# Patient Record
Sex: Female | Born: 1955 | Race: White | Hispanic: No
Health system: Southern US, Community
[De-identification: ages and names within clinical notes are randomized; demographics above are authoritative.]

## PROBLEM LIST (undated history)

## (undated) DIAGNOSIS — I10 Essential (primary) hypertension: Secondary | ICD-10-CM

## (undated) DIAGNOSIS — E119 Type 2 diabetes mellitus without complications: Secondary | ICD-10-CM

---

## 2020-05-11 ENCOUNTER — Emergency Department (HOSPITAL_COMMUNITY): Payer: BLUE CROSS/BLUE SHIELD

## 2020-05-11 ENCOUNTER — Encounter (HOSPITAL_COMMUNITY): Payer: Self-pay

## 2020-05-11 ENCOUNTER — Emergency Department (HOSPITAL_COMMUNITY)
Admission: EM | Admit: 2020-05-11 | Discharge: 2020-05-11 | Disposition: A | Discharge status: Home / Self Care | Payer: BLUE CROSS/BLUE SHIELD | Attending: Emergency Medicine | Admitting: Emergency Medicine

## 2020-05-11 DIAGNOSIS — R519 Headache, unspecified: Secondary | ICD-10-CM | POA: Diagnosis present

## 2020-05-11 DIAGNOSIS — Z20822 Contact with and (suspected) exposure to covid-19: Secondary | ICD-10-CM | POA: Insufficient documentation

## 2020-05-11 DIAGNOSIS — R531 Weakness: Secondary | ICD-10-CM | POA: Insufficient documentation

## 2020-05-11 DIAGNOSIS — R29818 Other symptoms and signs involving the nervous system: Secondary | ICD-10-CM | POA: Insufficient documentation

## 2020-05-11 DIAGNOSIS — I639 Cerebral infarction, unspecified: Secondary | ICD-10-CM | POA: Diagnosis not present

## 2020-05-11 HISTORY — DX: Type 2 diabetes mellitus without complications: E11.9

## 2020-05-11 HISTORY — DX: Essential (primary) hypertension: I10

## 2020-05-11 LAB — I-STAT CHEM 8, ED
BUN: 17 mg/dL (ref 8–23)
Calcium, Ion: 1.12 mmol/L — ABNORMAL LOW (ref 1.15–1.40)
Chloride: 102 mmol/L (ref 98–111)
Creatinine, Ser: 1 mg/dL (ref 0.44–1.00)
Glucose, Bld: 141 mg/dL — ABNORMAL HIGH (ref 70–99)
HCT: 47 % — ABNORMAL HIGH (ref 36.0–46.0)
Hemoglobin: 16 g/dL — ABNORMAL HIGH (ref 12.0–15.0)
Potassium: 3.6 mmol/L (ref 3.5–5.1)
Sodium: 139 mmol/L (ref 135–145)
TCO2: 26 mmol/L (ref 22–32)

## 2020-05-11 LAB — CBC
HCT: 49.8 % — ABNORMAL HIGH (ref 36.0–46.0)
Hemoglobin: 15.5 g/dL — ABNORMAL HIGH (ref 12.0–15.0)
MCH: 27.8 pg (ref 26.0–34.0)
MCHC: 31.1 g/dL (ref 30.0–36.0)
MCV: 89.4 fL (ref 80.0–100.0)
Platelets: 439 10*3/uL — ABNORMAL HIGH (ref 150–400)
RBC: 5.57 MIL/uL — ABNORMAL HIGH (ref 3.87–5.11)
RDW: 14.6 % (ref 11.5–15.5)
WBC: 11.4 10*3/uL — ABNORMAL HIGH (ref 4.0–10.5)
nRBC: 0 % (ref 0.0–0.2)

## 2020-05-11 LAB — DIFFERENTIAL
Abs Immature Granulocytes: 0.05 10*3/uL (ref 0.00–0.07)
Basophils Absolute: 0.1 10*3/uL (ref 0.0–0.1)
Basophils Relative: 1 %
Eosinophils Absolute: 0.2 10*3/uL (ref 0.0–0.5)
Eosinophils Relative: 2 %
Immature Granulocytes: 0 %
Lymphocytes Relative: 27 %
Lymphs Abs: 3.1 10*3/uL (ref 0.7–4.0)
Monocytes Absolute: 1 10*3/uL (ref 0.1–1.0)
Monocytes Relative: 9 %
Neutro Abs: 7 10*3/uL (ref 1.7–7.7)
Neutrophils Relative %: 61 %

## 2020-05-11 LAB — COMPREHENSIVE METABOLIC PANEL
ALT: 22 U/L (ref 0–44)
AST: 21 U/L (ref 15–41)
Albumin: 4.1 g/dL (ref 3.5–5.0)
Alkaline Phosphatase: 92 U/L (ref 38–126)
Anion gap: 13 (ref 5–15)
BUN: 15 mg/dL (ref 8–23)
CO2: 24 mmol/L (ref 22–32)
Calcium: 9.8 mg/dL (ref 8.9–10.3)
Chloride: 100 mmol/L (ref 98–111)
Creatinine, Ser: 1.13 mg/dL — ABNORMAL HIGH (ref 0.44–1.00)
GFR, Estimated: 54 mL/min — ABNORMAL LOW (ref >60)
Glucose, Bld: 146 mg/dL — ABNORMAL HIGH (ref 70–99)
Potassium: 3.6 mmol/L (ref 3.5–5.1)
Sodium: 137 mmol/L (ref 135–145)
Total Bilirubin: 0.8 mg/dL (ref 0.3–1.2)
Total Protein: 7.2 g/dL (ref 6.5–8.1)

## 2020-05-11 LAB — URINALYSIS, ROUTINE W REFLEX MICROSCOPIC
Bacteria, UA: NONE SEEN
Bilirubin Urine: NEGATIVE
Glucose, UA: >=500 mg/dL — AB
Hgb urine dipstick: NEGATIVE
Ketones, ur: NEGATIVE mg/dL
Leukocytes,Ua: NEGATIVE
Nitrite: NEGATIVE
Protein, ur: NEGATIVE mg/dL
Specific Gravity, Urine: 1.013 (ref 1.005–1.030)
pH: 7 (ref 5.0–8.0)

## 2020-05-11 LAB — RAPID URINE DRUG SCREEN, HOSP PERFORMED
Amphetamines: NOT DETECTED
Barbiturates: NOT DETECTED
Benzodiazepines: NOT DETECTED
Cocaine: NOT DETECTED
Opiates: NOT DETECTED
Tetrahydrocannabinol: NOT DETECTED

## 2020-05-11 LAB — CBG MONITORING, ED: Glucose-Capillary: 148 mg/dL — ABNORMAL HIGH (ref 70–99)

## 2020-05-11 LAB — RESPIRATORY PANEL BY RT PCR (FLU A&B, COVID)
Influenza A by PCR: NEGATIVE
Influenza B by PCR: NEGATIVE
SARS Coronavirus 2 by RT PCR: NEGATIVE

## 2020-05-11 LAB — PROTIME-INR
INR: 0.9 (ref 0.8–1.2)
Prothrombin Time: 11.5 seconds (ref 11.4–15.2)

## 2020-05-11 LAB — APTT: aPTT: 25 seconds (ref 24–36)

## 2020-05-11 LAB — ETHANOL: Alcohol, Ethyl (B): <10 mg/dL (ref <10)

## 2020-05-11 MED ORDER — KETOROLAC TROMETHAMINE 30 MG/ML IJ SOLN
30.0000 mg | Freq: Once | INTRAMUSCULAR | Status: AC
  Administered 2020-05-11: 30 mg via INTRAVENOUS
  Filled 2020-05-11: qty 1

## 2020-05-11 MED ORDER — IOHEXOL 350 MG/ML SOLN
75.0000 mL | Freq: Once | INTRAVENOUS | Status: AC | PRN
  Administered 2020-05-11: 75 mL via INTRAVENOUS

## 2020-05-11 MED ORDER — DIPHENHYDRAMINE HCL 50 MG/ML IJ SOLN
12.5000 mg | Freq: Once | INTRAMUSCULAR | Status: AC
  Administered 2020-05-11: 12.5 mg via INTRAVENOUS
  Filled 2020-05-11: qty 1

## 2020-05-11 MED ORDER — PROCHLORPERAZINE EDISYLATE 10 MG/2ML IJ SOLN
10.0000 mg | Freq: Once | INTRAMUSCULAR | Status: AC
  Administered 2020-05-11: 10 mg via INTRAVENOUS
  Filled 2020-05-11: qty 2
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
## 2020-05-11 NOTE — ED Notes (Signed)
Dr. Amada Jupiter cancelled CODE STOKE at [phone]
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
## 2020-05-11 NOTE — Discharge Instructions (Addendum)
Your work-up and imaging today was all reassuring in regards to ruling out stroke, tumor, or other acute abnormality.  We suspect you are having a complicated migraine or atypical headache leading to some of the transient neurologic deficits.  After improved symptoms and imaging, we feel you are safe for discharge home.  Please follow-up with a neurologist close to your home or one provided.  Please rest and stay hydrated.  If any symptoms change or worsen, please return to the emergency department.

## 2021-02-12 IMAGING — MR MR HEAD W/O CM
7 of 12 series · 26 of 48 positions shown · non-contrast
Comparison: CT and CTA from earlier today

CLINICAL DATA: Left-sided neglect.

EXAM:
MRI HEAD WITHOUT CONTRAST
TECHNIQUE: Multiplanar, multiecho pulse sequences of the brain and surrounding
structures were obtained without intravenous contrast.

[Series 2: DWI · axial · 3.0mm · 0.94mm/px · z∈[-71,+75]mm · 8 of 100 slices shown (1 of 2)]
[im 1/100]
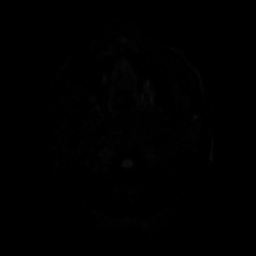
[im 15/100]
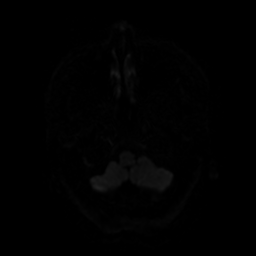
[im 29/100]
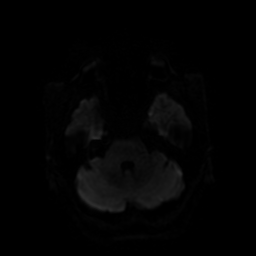
[im 43/100]
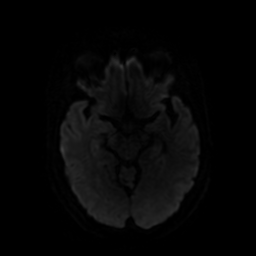
[im 57/100]
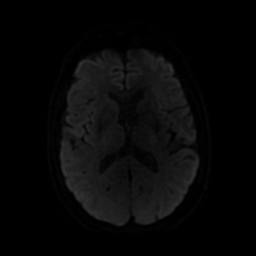
[im 71/100]
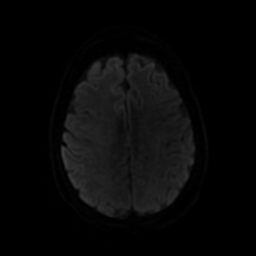
[im 85/100]
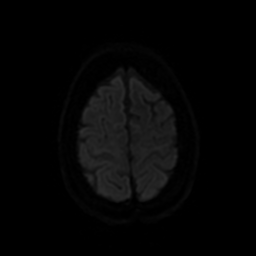
[im 100/100]
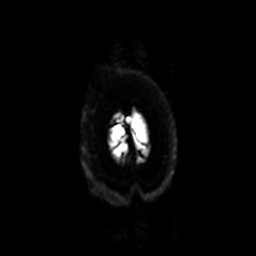

[Series 3: DWI · coronal · 4.0mm · 0.94mm/px · 6 of 72 slices shown (2 of 2)]
[im 1/72]
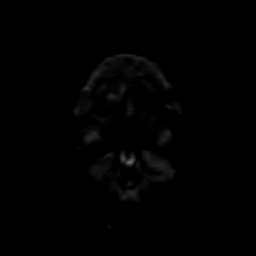
[im 15/72]
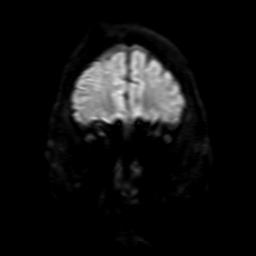
[im 29/72]
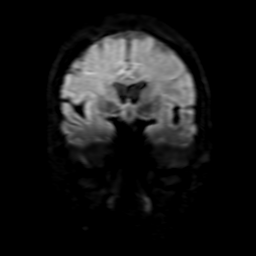
[im 43/72]
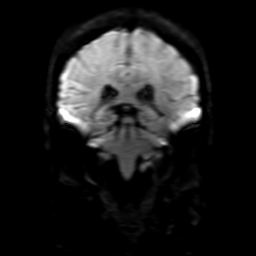
[im 57/72]
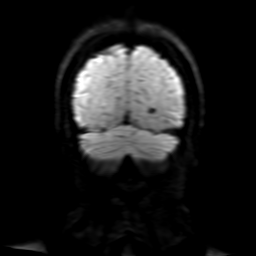
[im 72/72]
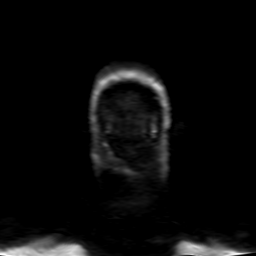

[Series 4: FLAIR · sagittal · 5.0mm · 0.23mm/px · 2 of 25 slices shown (1 of 2)]
[im 1/25]
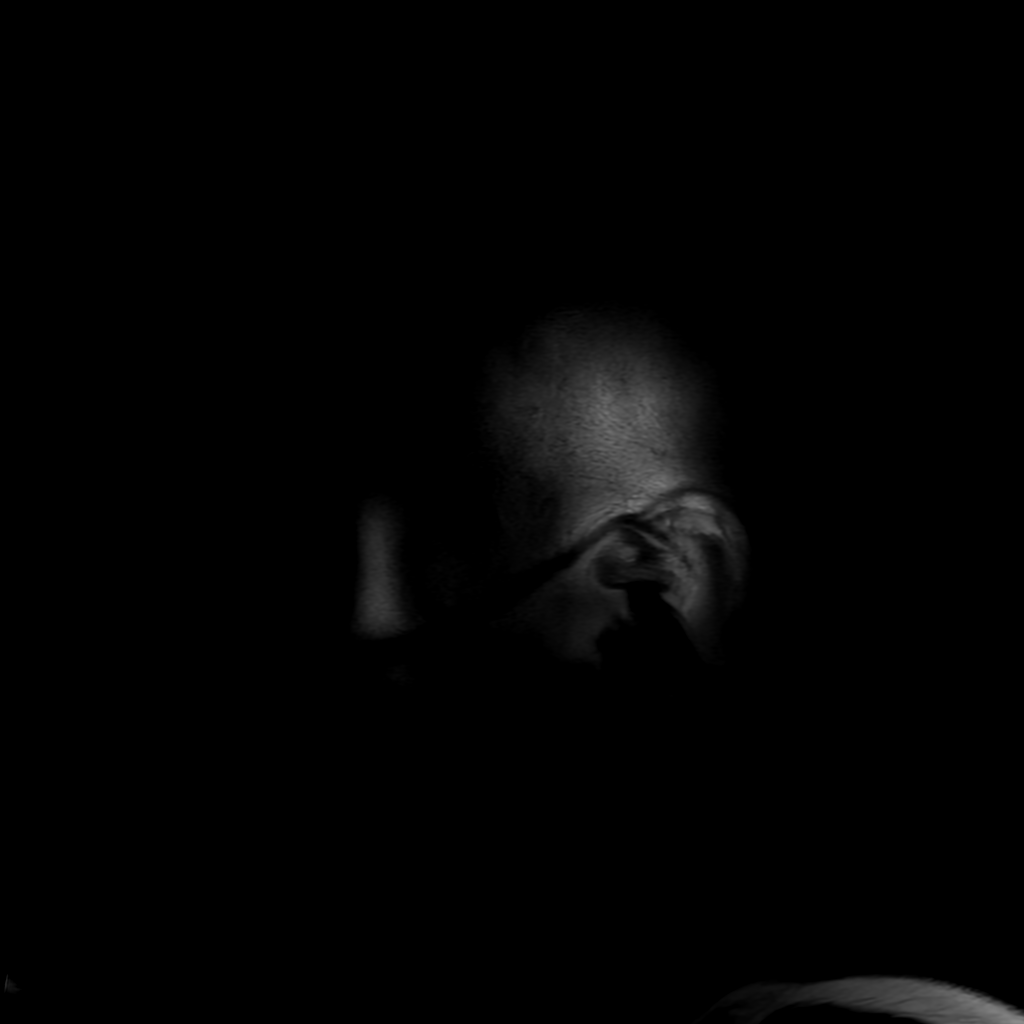
[im 25/25]
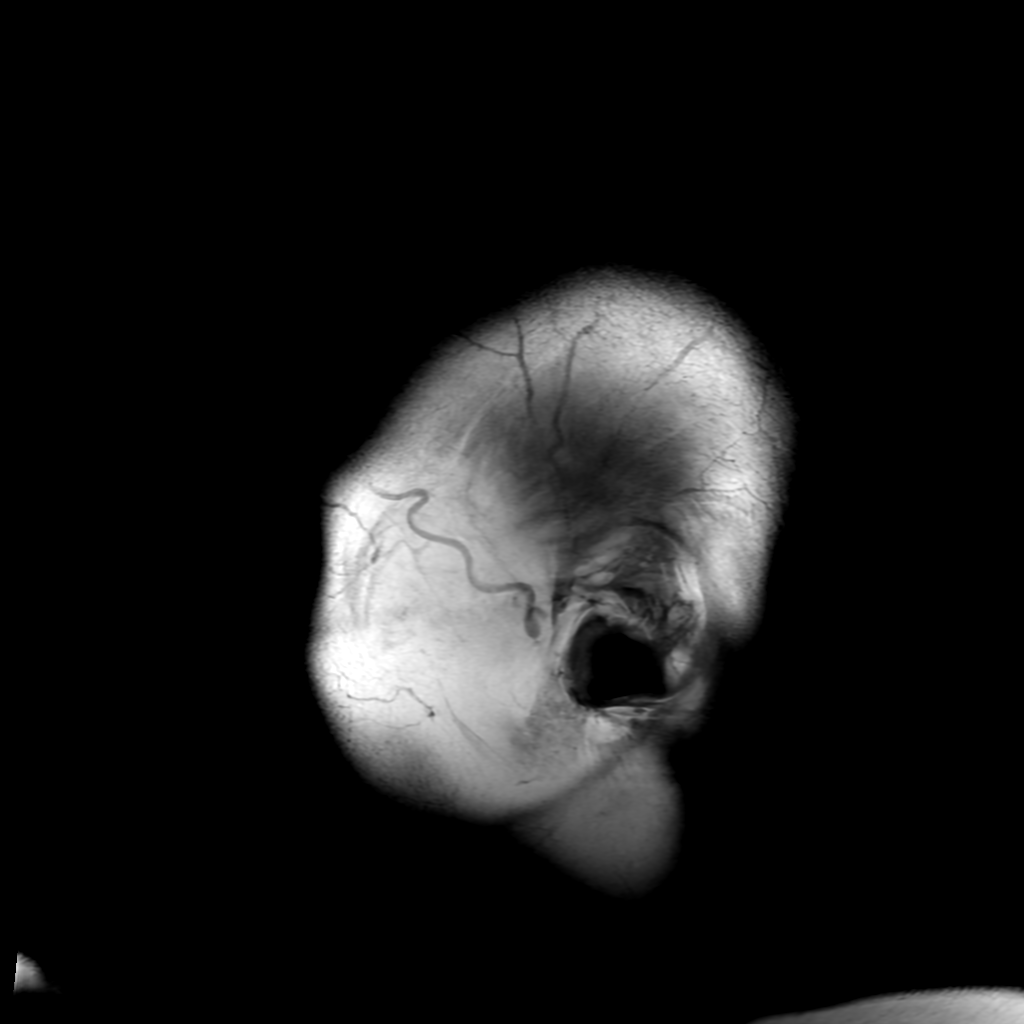

[Series 5: T2 · axial · 5.0mm · 0.23mm/px · 1 of 26 slices shown]
[im 1/26]
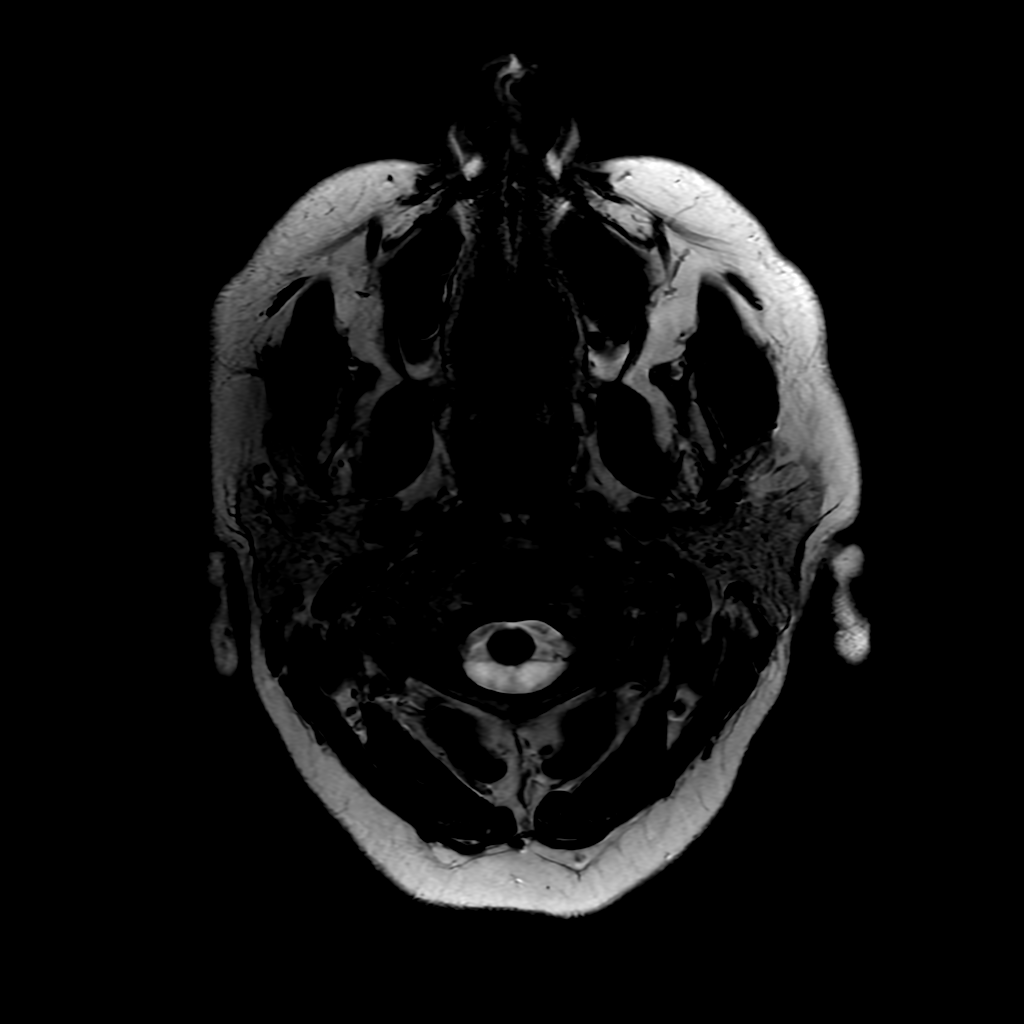

[Series 6: FLAIR · axial · 3.0mm · 0.45mm/px · z∈[-60,+83]mm · 2 of 25 slices shown (2 of 2)]
[im 1/25]
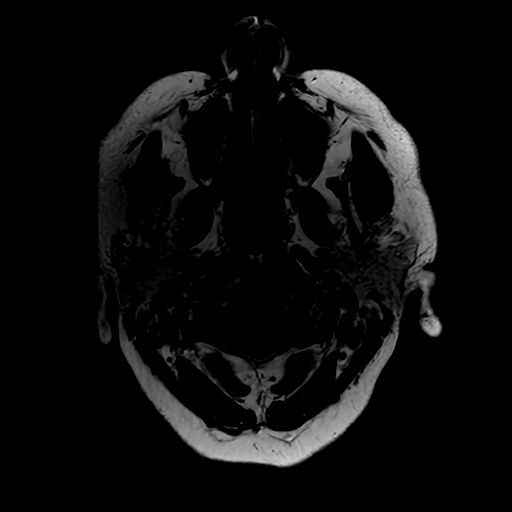
[im 25/25]
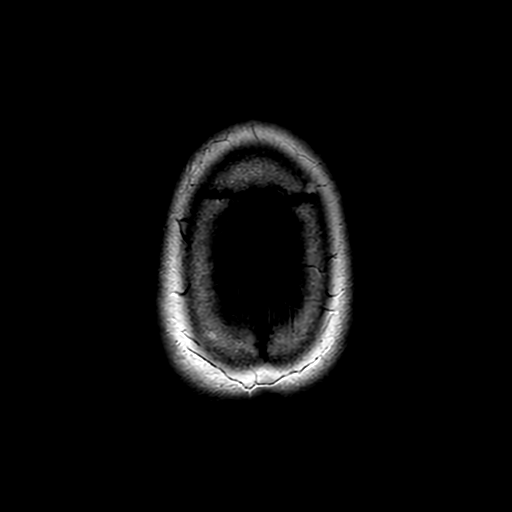

[Series 250: ADC · axial · 3.0mm · 0.94mm/px · z∈[-71,+75]mm · 4 of 50 slices shown (1 of 2)]
[im 1/50]
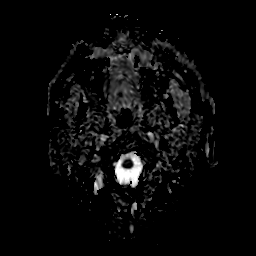
[im 17/50]
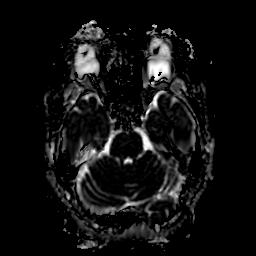
[im 33/50]
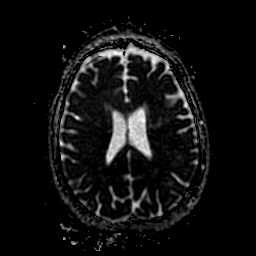
[im 50/50]
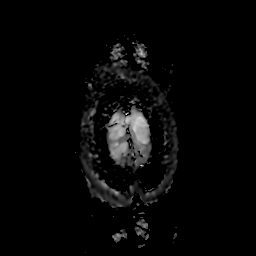

[Series 350: ADC · coronal · 4.0mm · 0.94mm/px · 3 of 36 slices shown (2 of 2)]
[im 1/36]
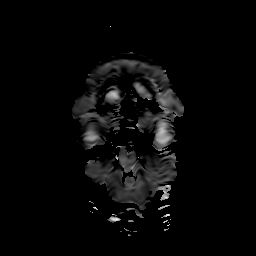
[im 18/36]
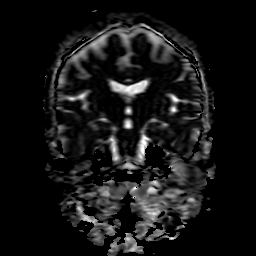
[im 36/36]
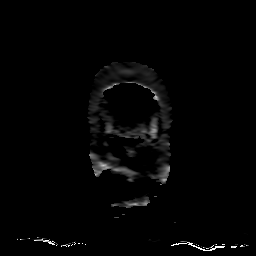

[26 of 48 positions shown; findings below may reference images not displayed]

FINDINGS: Brain: No acute infarction, hemorrhage, hydrocephalus, extra-axial
collection or mass lesion. Small remote white matter insults
attributed to chronic microvascular ischemia given medical
history-mild in extent. Normal brain volume.

Vascular: Normal flow voids

Skull and upper cervical spine: Normal marrow signal.

Sinuses/Orbits: Single opacified right ethmoid air cell adjacent to
the orbit.
IMPRESSION: 1. No acute finding, including infarct.
2. Mild chronic small vessel disease.
